# Patient Record
Sex: Female | Born: 1965 | Hispanic: No
Health system: Southern US, Community
[De-identification: ages and names within clinical notes are randomized; demographics above are authoritative.]

---

## 2004-12-31 ENCOUNTER — Other Ambulatory Visit: Admission: RE | Admit: 2004-12-31 | Discharge: 2004-12-31 | Payer: Self-pay | Admitting: Family Medicine

## 2006-03-11 ENCOUNTER — Other Ambulatory Visit: Admission: RE | Admit: 2006-03-11 | Discharge: 2006-03-11 | Payer: Self-pay | Admitting: Family Medicine

## 2008-06-16 ENCOUNTER — Other Ambulatory Visit: Admission: RE | Admit: 2008-06-16 | Discharge: 2008-06-16 | Payer: Self-pay | Admitting: Family Medicine

## 2009-11-08 ENCOUNTER — Other Ambulatory Visit: Admission: RE | Admit: 2009-11-08 | Discharge: 2009-11-08 | Payer: Self-pay | Admitting: Family Medicine

## 2012-07-28 ENCOUNTER — Other Ambulatory Visit: Payer: Self-pay | Admitting: Family Medicine

## 2012-07-28 ENCOUNTER — Other Ambulatory Visit (HOSPITAL_COMMUNITY)
Admission: RE | Admit: 2012-07-28 | Discharge: 2012-07-28 | Disposition: A | Discharge status: Home / Self Care | Payer: BC Managed Care – PPO | Source: Ambulatory Visit | Attending: Family Medicine | Admitting: Family Medicine

## 2012-07-28 DIAGNOSIS — Z0000 Encounter for general adult medical examination without abnormal findings: Secondary | ICD-10-CM | POA: Insufficient documentation

## 2012-07-28 DIAGNOSIS — Z1151 Encounter for screening for human papillomavirus (HPV): Secondary | ICD-10-CM | POA: Insufficient documentation

## 2013-02-22 ENCOUNTER — Other Ambulatory Visit: Payer: Self-pay | Admitting: Sports Medicine

## 2013-02-22 DIAGNOSIS — M25522 Pain in left elbow: Secondary | ICD-10-CM

## 2013-02-24 ENCOUNTER — Ambulatory Visit
Admission: RE | Admit: 2013-02-24 | Discharge: 2013-02-24 | Disposition: A | Discharge status: Home / Self Care | Payer: BC Managed Care – PPO | Source: Ambulatory Visit | Attending: Sports Medicine | Admitting: Sports Medicine

## 2013-02-24 DIAGNOSIS — M25522 Pain in left elbow: Secondary | ICD-10-CM

## 2013-02-25 ENCOUNTER — Other Ambulatory Visit: Payer: BC Managed Care – PPO

## 2015-05-30 IMAGING — CT CT 3D INDEPENDENT WKST
2 of 8 series · 6 of 20 positions shown, 7 images · non-contrast
Comparison: None.

CLINICAL DATA: Left elbow fracture after fall 3 weeks ago. Casted.
Continued pain.

EXAM:
CT OF THE LEFT ELBOW WITHOUT CONTRAST; 3-DIMENSIONAL CT IMAGE
RENDERING ON INDEPENDENT WORKSTATION
TECHNIQUE: Multidetector CT imaging was performed according to the standard
protocol. Multiplanar CT image reconstructions were also generated.;
3-dimensional CT images were rendered by post-processing of the
original CT data on an independent workstation. The 3-dimensional CT
images were interpreted and findings were reported in the
accompanying complete CT report for this study

[Series 101: axial soft · axial · 0.37mm/px · z∈[+35,+110]mm · 3 of 84 slices shown, 4 images]
[im 1/84  soft-tissue]
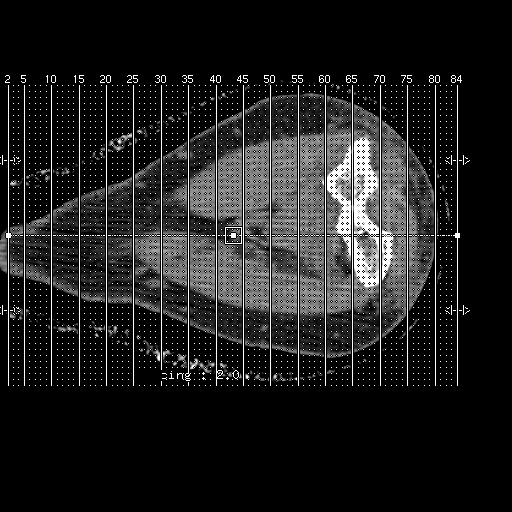
[im 1/84  bone]
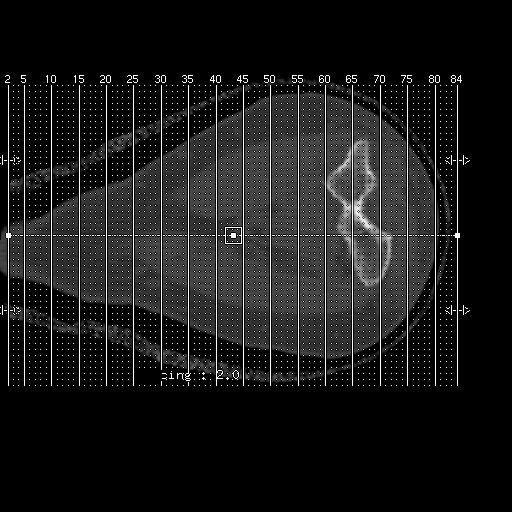
[im 42/84  bone]
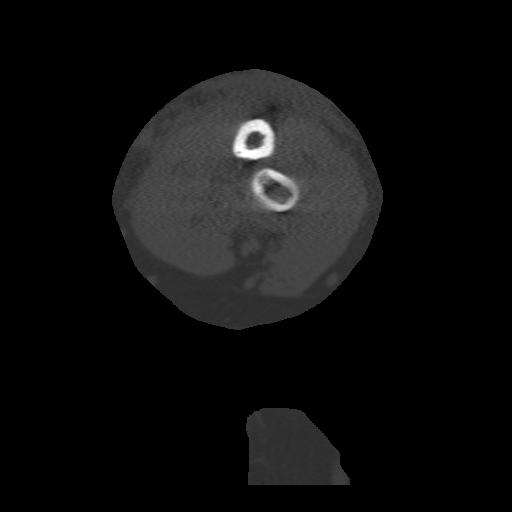
[im 84/84  bone]
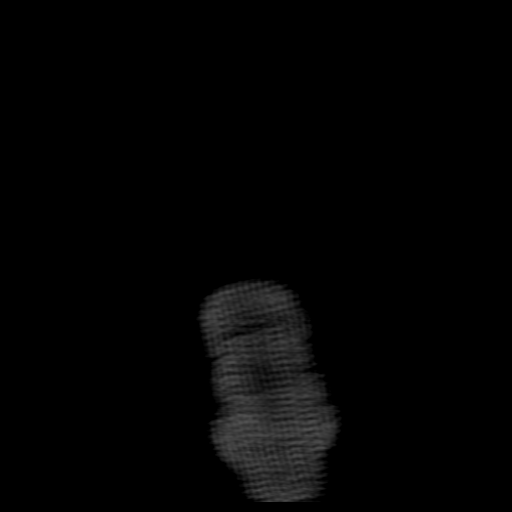

[Series 104: sag soft · coronal · 0.35mm/px · 3 of 71 slices shown]
[im 18/71  bone]
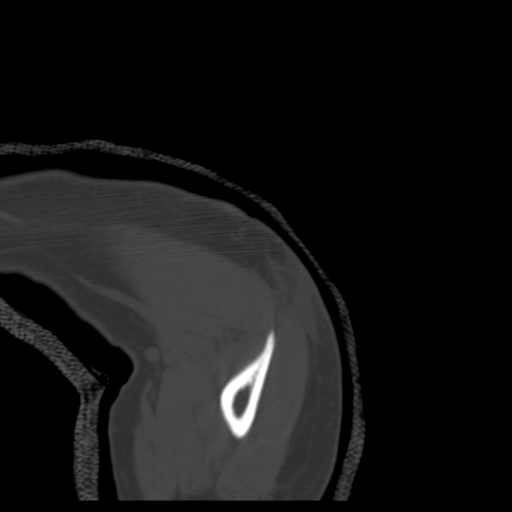
[im 30/71  bone]
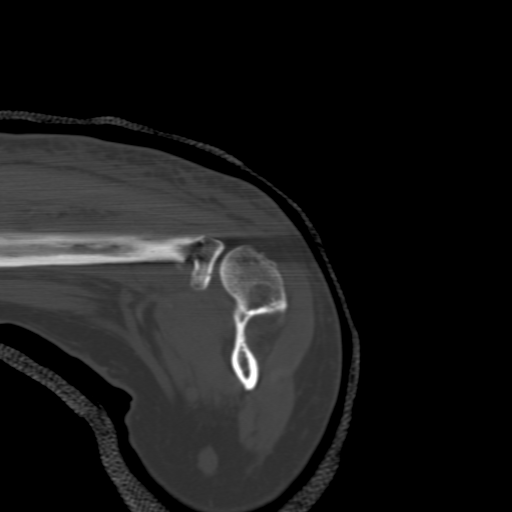
[im 41/71  bone]
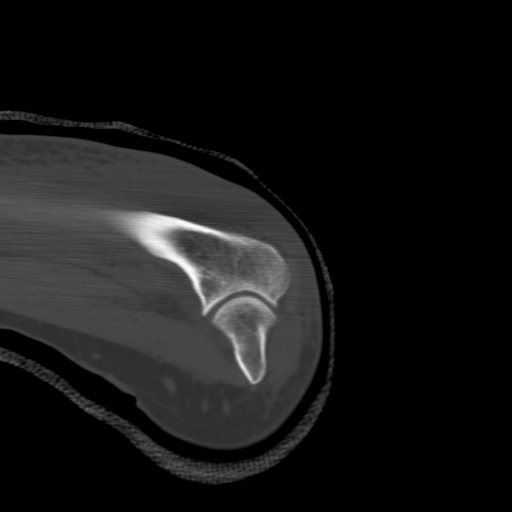

[6 of 20 positions shown; findings below may reference images not displayed]

FINDINGS: Mildly comminuted fracture of the proximal radius includes a
transverse fracture of the radial neck (well shown on image 23 of
series 401) and a curved fracture of the anterior 30% of the radial
head as shown on images 43 through 44 of series 400. The radial
shaft is posteriorly displaced with respect to the radial head by
about 3 mm.

Acute fracture of the coronoid process of the ulna with 2 mm
distraction as shown on image 30 of series 401.

Small linear bony fragments along the posterior margin of the
lateral humeral condyle/capitellum as shown on images 20 through 23
of series 401, with slight scalloping in this vicinity, probably
from impaction fracture.

As expected there is large elbow joint effusion. Questionable fleck
of bone in the vicinity of the common flexor tendon attachment site,
image 29 of series 403, query partial avulsion of the common flexor
tendon if.

As expected there is considerable dorsal subcutaneous edema in the
vicinity of the fractures.
IMPRESSION: 1. Comminuted proximal radial fracture including a transverse mildly
displaced radial neck fracture and a fracture of the volar 30% of
the radial head.
2. Coronoid process fracture of the ulna with 2 mm distraction.
3. Small linear bony fragments in slight impacted margin of the
posterior capitellum.
4. Questionable small partial avulsion along the common flexor
tendon attachment site to the medial epicondyle.
5. Larger elbow joint effusion with dorsal subcutaneous edema.

## 2015-11-01 ENCOUNTER — Other Ambulatory Visit: Payer: Self-pay | Admitting: Family Medicine

## 2015-11-01 ENCOUNTER — Other Ambulatory Visit (HOSPITAL_COMMUNITY)
Admission: RE | Admit: 2015-11-01 | Discharge: 2015-11-01 | Disposition: A | Discharge status: Home / Self Care | Payer: 59 | Source: Ambulatory Visit | Attending: Family Medicine | Admitting: Family Medicine

## 2015-11-01 DIAGNOSIS — Z124 Encounter for screening for malignant neoplasm of cervix: Secondary | ICD-10-CM | POA: Diagnosis present

## 2015-11-01 DIAGNOSIS — Z1151 Encounter for screening for human papillomavirus (HPV): Secondary | ICD-10-CM | POA: Insufficient documentation

## 2015-11-02 LAB — CYTOLOGY - PAP

## 2018-07-01 ENCOUNTER — Other Ambulatory Visit: Payer: Self-pay | Admitting: Family Medicine

## 2018-07-01 ENCOUNTER — Ambulatory Visit
Admission: RE | Admit: 2018-07-01 | Discharge: 2018-07-01 | Disposition: A | Discharge status: Home / Self Care | Payer: 59 | Source: Ambulatory Visit | Attending: Family Medicine | Admitting: Family Medicine

## 2018-07-01 DIAGNOSIS — R52 Pain, unspecified: Secondary | ICD-10-CM

## 2020-10-03 IMAGING — CR RIGHT FOOT COMPLETE - 3+ VIEW
3 series · 3 of 3 positions shown · non-contrast
Comparison: None.

CLINICAL DATA: Right foot pain

EXAM:
RIGHT FOOT COMPLETE - 3+ VIEW

[t foot ap right]
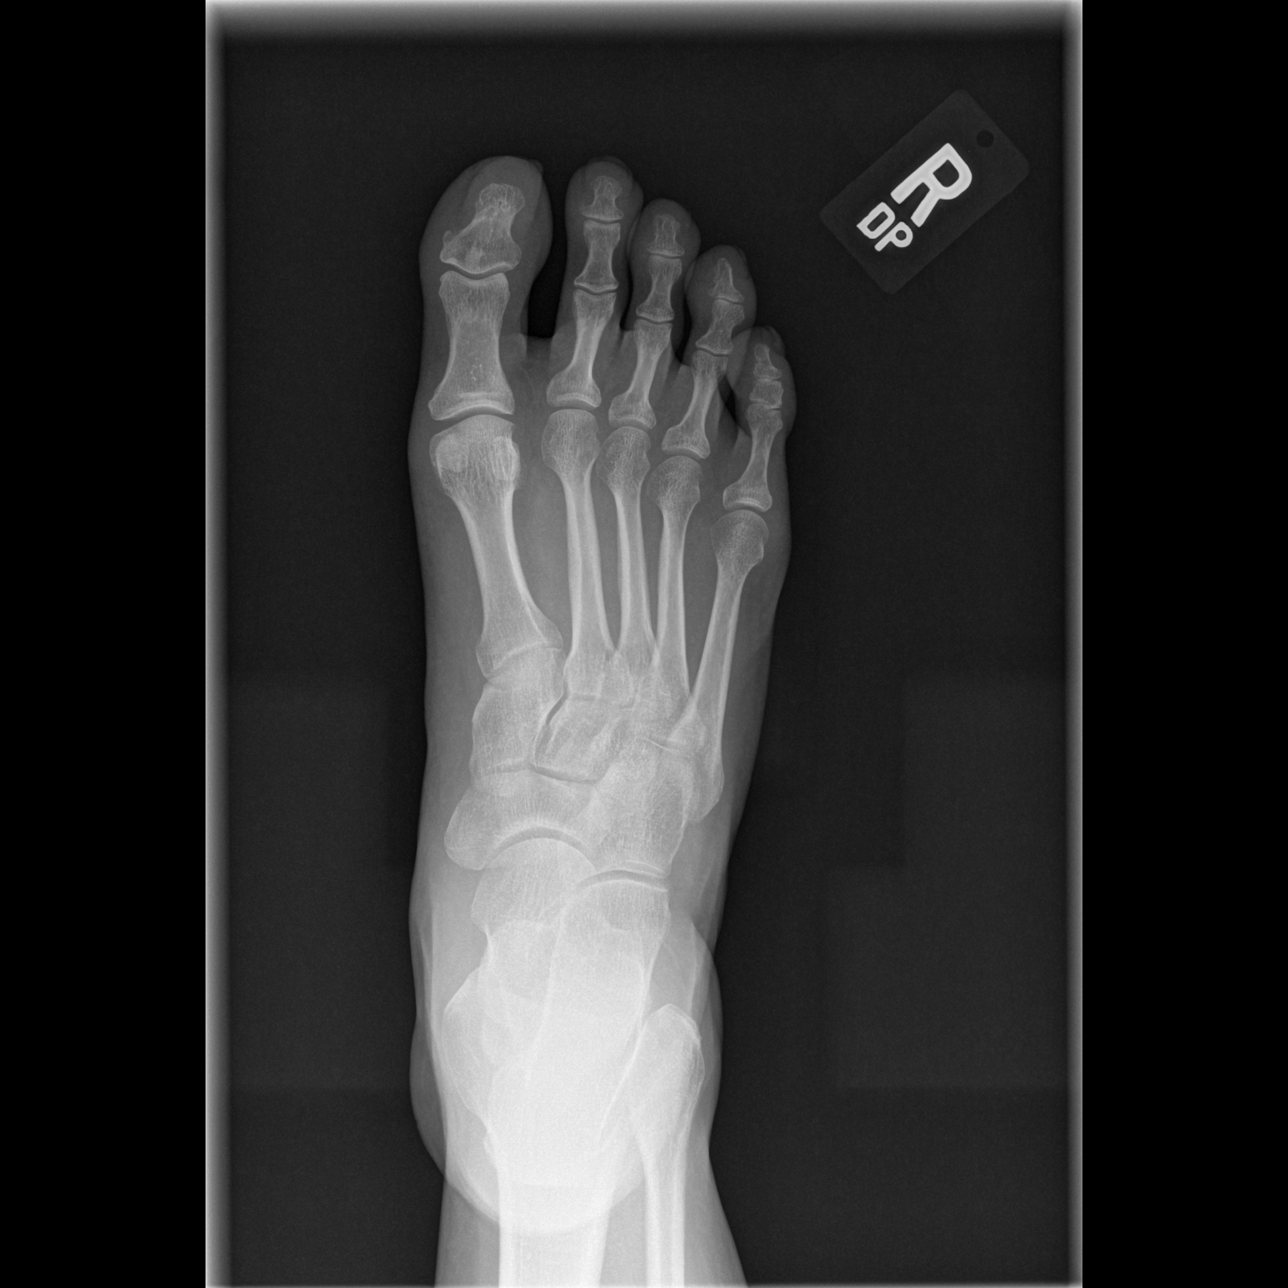

[t foot oblique right]
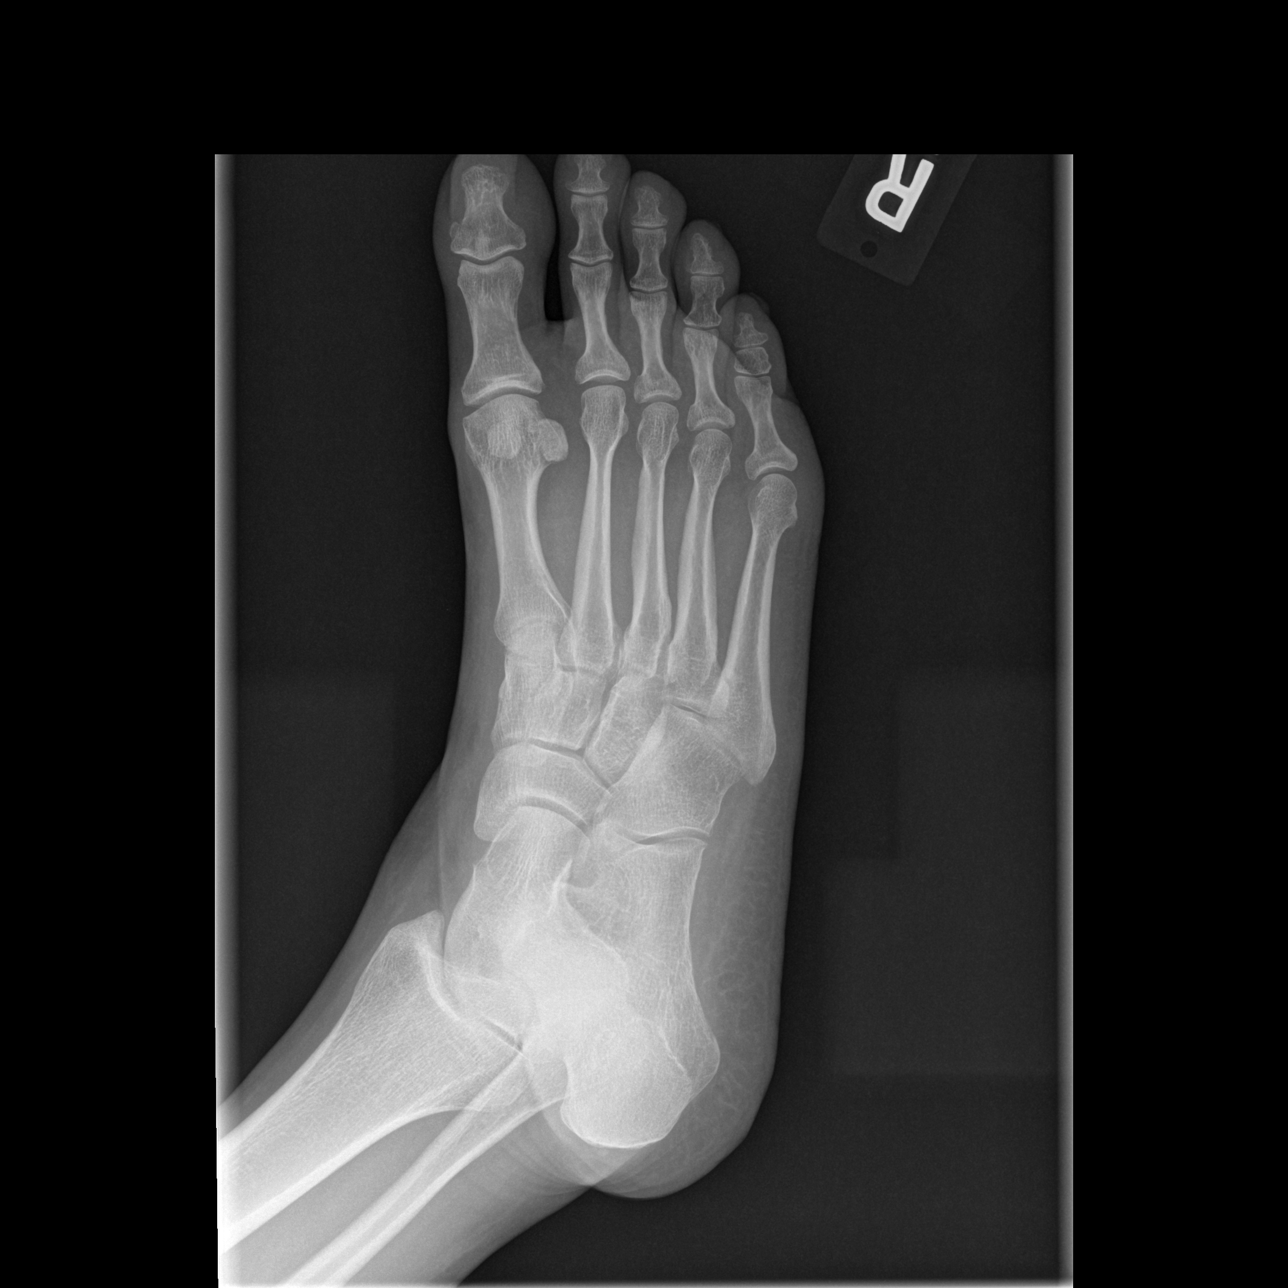

[t foot lat right]
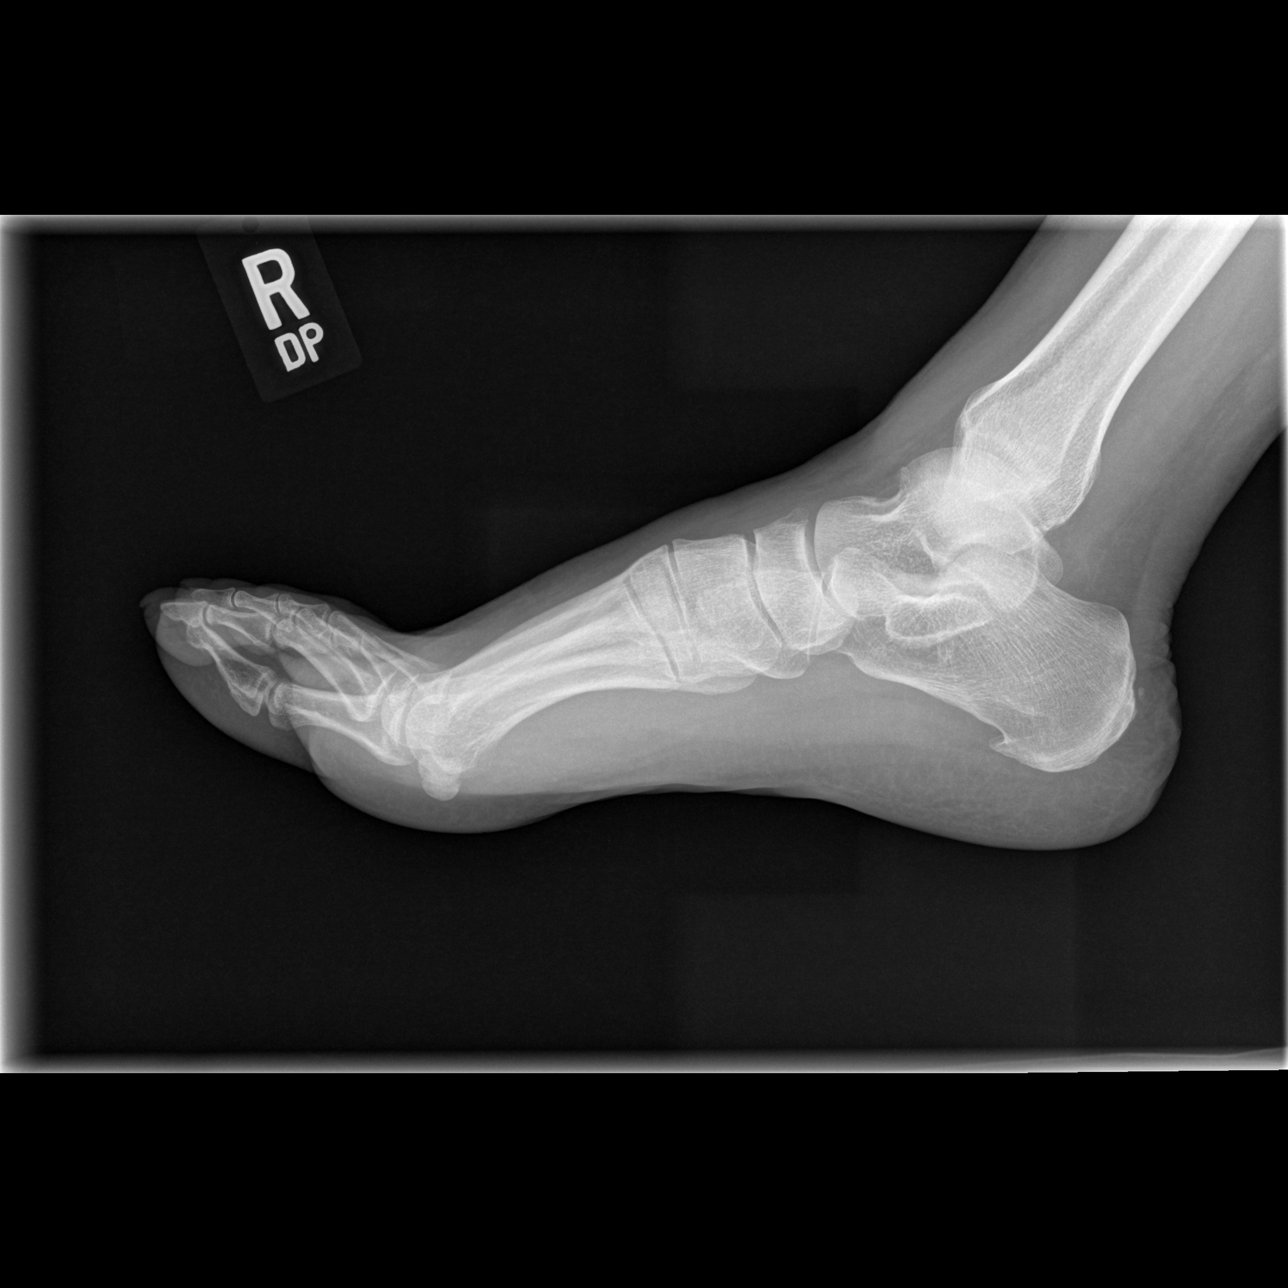

[3 of 3 positions shown; findings below may reference images not displayed]

FINDINGS: There is no evidence of fracture or dislocation. There is no
evidence of arthropathy or other focal bone abnormality. Soft
tissues are unremarkable. There is a small plantar calcaneal spur.
IMPRESSION: Negative.

## 2023-02-27 ENCOUNTER — Other Ambulatory Visit: Payer: Self-pay | Admitting: Family Medicine

## 2023-02-27 ENCOUNTER — Ambulatory Visit
Admission: RE | Admit: 2023-02-27 | Discharge: 2023-02-27 | Disposition: A | Discharge status: Home / Self Care | Payer: No Typology Code available for payment source | Source: Ambulatory Visit | Attending: Family Medicine | Admitting: Family Medicine

## 2023-02-27 DIAGNOSIS — M79644 Pain in right finger(s): Secondary | ICD-10-CM
